# Patient Record
Sex: Male | Born: 1990 | Race: Black or African American | Hispanic: No | Marital: Single | Smoking: Never smoker
Health system: Southern US, Community
[De-identification: ages and names within clinical notes are randomized; demographics above are authoritative.]

## PROBLEM LIST (undated history)

## (undated) HISTORY — PX: LEG SURGERY: SHX1003

## (undated) HISTORY — PX: EYE SURGERY: SHX253

---

## 2016-04-11 ENCOUNTER — Encounter (HOSPITAL_COMMUNITY): Payer: Self-pay | Admitting: Emergency Medicine

## 2016-04-11 ENCOUNTER — Emergency Department (HOSPITAL_COMMUNITY): Payer: Self-pay

## 2016-04-11 ENCOUNTER — Emergency Department (HOSPITAL_COMMUNITY)
Admission: EM | Admit: 2016-04-11 | Discharge: 2016-04-11 | Disposition: A | Discharge status: Home / Self Care | Payer: Self-pay | Attending: Emergency Medicine | Admitting: Emergency Medicine

## 2016-04-11 DIAGNOSIS — Y939 Activity, unspecified: Secondary | ICD-10-CM | POA: Insufficient documentation

## 2016-04-11 DIAGNOSIS — S82392A Other fracture of lower end of left tibia, initial encounter for closed fracture: Secondary | ICD-10-CM

## 2016-04-11 DIAGNOSIS — S82832A Other fracture of upper and lower end of left fibula, initial encounter for closed fracture: Secondary | ICD-10-CM | POA: Insufficient documentation

## 2016-04-11 DIAGNOSIS — Y9289 Other specified places as the place of occurrence of the external cause: Secondary | ICD-10-CM | POA: Insufficient documentation

## 2016-04-11 DIAGNOSIS — S82402A Unspecified fracture of shaft of left fibula, initial encounter for closed fracture: Secondary | ICD-10-CM

## 2016-04-11 DIAGNOSIS — W010XXA Fall on same level from slipping, tripping and stumbling without subsequent striking against object, initial encounter: Secondary | ICD-10-CM | POA: Insufficient documentation

## 2016-04-11 DIAGNOSIS — Y999 Unspecified external cause status: Secondary | ICD-10-CM | POA: Insufficient documentation

## 2016-04-11 DIAGNOSIS — S82892A Other fracture of left lower leg, initial encounter for closed fracture: Secondary | ICD-10-CM

## 2016-04-11 DIAGNOSIS — S82202A Unspecified fracture of shaft of left tibia, initial encounter for closed fracture: Secondary | ICD-10-CM

## 2016-04-11 MED ORDER — NAPROXEN 500 MG PO TABS: 500.0000 mg | ORAL_TABLET | Freq: Two times a day (BID) | ORAL | 0 refills | Status: AC | PRN

## 2016-04-11 MED ORDER — HYDROCODONE-ACETAMINOPHEN 5-325 MG PO TABS: 1.0000 | ORAL_TABLET | Freq: Four times a day (QID) | ORAL | 0 refills | Status: AC | PRN

## 2016-04-11 MED ORDER — HYDROCODONE-ACETAMINOPHEN 5-325 MG PO TABS
1.0000 | ORAL_TABLET | Freq: Once | ORAL | Status: AC
  Administered 2016-04-11: 1 via ORAL
  Filled 2016-04-11: qty 1

## 2016-04-11 NOTE — Discharge Instructions (Signed)
Wear ankle splint at all times until you see the orthopedist. Use crutches for all weight bearing activities. Ice and elevate ankle throughout the day, using ice pack for no more than 20 minutes every hour.  Alternate between naprosyn and norco for pain relief. Do not drive or operate machinery with pain medication use. Call your regular doctor first thing Monday morning to ask for a referral to an orthopedist in your area for ongoing management of your ankle fracture; if you stay in the [place] area then you can call the orthopedist listed above for ongoing management of your ankle fracture in the next 3-5 days. Return to the ER for changes or worsening symptoms.

## 2016-04-11 NOTE — ED Provider Notes (Addendum)
[facility]-EMERGENCY DEPT Provider Note   CSN: [id number] Arrival date & time: 04/11/16  0545     History   Chief Complaint Chief Complaint  Patient presents with  . Ankle Injury    HPI Terry Chandler is a 26 y.o. male, who presents to the ED with complaints of left ankle injury sustained after he twisted his ankle while he was stepping back, causing him to fall landing onto his ankle. He describes the pain as 8/10 constant dull left ankle pain radiating into the bottom of his foot, worse with walking and movement, improved with rest, and with no treatments tried prior to arrival. Associated symptoms include swelling and bruising. He denies head injury, LOC, numbness, tingling, focal weakness, or any other injury sustained. Denies any other complaints at this time. He has never injured this ankle before, and has never been seen by an orthopedic doctor. He is traveling from out of town, he will be returning to [place] where he lives today. He does have PCP back in [place]. He had no plans of staying in [place] any longer than today.   The history is provided by the patient and medical records. No language interpreter was used.  Ankle Injury  This is a new problem. The current episode started 6 to 12 hours ago. The problem occurs constantly. The problem has not changed since onset.The symptoms are aggravated by walking and standing. The symptoms are relieved by rest. He has tried nothing for the symptoms. The treatment provided no relief.    History reviewed. No pertinent past medical history.  There are no active problems to display for this patient.   Past Surgical History:  Procedure Laterality Date  . EYE SURGERY         Home Medications    Prior to Admission medications   Not on File    Family History No family history on file.  Social History Social History  Substance Use Topics  . Smoking status: Never Smoker  . Smokeless tobacco: Never Used  . Alcohol use Yes     Comment: 1x a week     Allergies   Patient has no allergy information on record.   Review of Systems Review of Systems  HENT: Negative for facial swelling (no head inj).   Musculoskeletal: Positive for arthralgias and joint swelling.  Skin: Positive for color change (bruising). Negative for wound.  Allergic/Immunologic: Negative for immunocompromised state.  Neurological: Negative for weakness and numbness.  Psychiatric/Behavioral: Negative for confusion.   10 Systems reviewed and are negative for acute change except as noted in the HPI.   Physical Exam Updated Vital Signs BP 142/88 (BP Location: Right Arm)   Pulse 90   Temp 98.1 F (36.7 C) (Oral)   Resp 18   SpO2 100%    Physical Exam  Constitutional: He is oriented to person, place, and time. Vital signs are normal. He appears well-developed and well-nourished.  Non-toxic appearance. No distress.  Afebrile, nontoxic, NAD  HENT:  Head: Normocephalic and atraumatic.  Mouth/Throat: Mucous membranes are normal.  Eyes: Conjunctivae and EOM are normal. Right eye exhibits no discharge. Left eye exhibits no discharge.  Neck: Normal range of motion. Neck supple.  Cardiovascular: Normal rate and intact distal pulses.   Pulmonary/Chest: Effort normal. No respiratory distress.  Abdominal: Normal appearance. He exhibits no distension.  Musculoskeletal:       Left ankle: He exhibits decreased range of motion (due to pain), swelling and ecchymosis. He exhibits no deformity, no laceration  and normal pulse. Tenderness. Lateral malleolus and medial malleolus tenderness found. Achilles tendon normal.  L ankle with limited ROM due to pain, +swelling, no crepitus or deformity, with moderate TTP of the lateral and medial malleoli, but no TTP or swelling of fore foot or calf. No break in skin. +bruising without erythema. No warmth. Achilles intact. Good pedal pulse and cap refill of all toes. Wiggling toes without difficulty. Sensation grossly  intact. Soft compartments   Neurological: He is alert and oriented to person, place, and time. He has normal strength. No sensory deficit.  Skin: Skin is warm, dry and intact. No rash noted.  Psychiatric: He has a normal mood and affect.  Nursing note and vitals reviewed.    ED Treatments / Results  Labs (all labs ordered are listed, but only abnormal results are displayed) Labs Reviewed - No data to display  EKG  EKG Interpretation None       Radiology Dg Ankle Complete Left  Result Date: 04/11/2016 CLINICAL DATA:  26 y/o  M; left ankle pain after tripping. EXAM: LEFT ANKLE COMPLETE - 3+ VIEW COMPARISON:  None. FINDINGS: Mildly displaced oblique acute fracture through the lower fibula with fracture line above level of tibial plafond. Probable nondisplaced fracture of the posterior malleolus of tibia. The medial ankle mortise is markedly widened indicating ankle instability. Talar dome is intact. Soft tissue swelling about the ankle joint. Small plantar calcaneal enthesophyte. IMPRESSION: 1. Mildly displaced oblique acute fracture through the lower fibula with fracture line above level of tibial plafond. 2. Probable nondisplaced fracture of the posterior malleolus of tibia. 3. Marked widening of the medial ankle mortise indicating ankle instability. Electronically Signed   By: Mitzi Hansen M.D.   On: 04/11/2016 06:29    Procedures Procedures (including critical care time)  SPLINT APPLICATION Date/Time: 8:26 AM Authorized by: Rhona Raider Consent: Verbal consent obtained. Risks and benefits: risks, benefits and alternatives were discussed Consent given by: patient Splint applied by: orthopedic technician Location details: L ankle Splint type: cadillac splint Supplies used: orthoglass Post-procedure: The splinted body part was neurovascularly unchanged following the procedure. Patient tolerance: Patient tolerated the procedure well with no immediate  complications.     Medications Ordered in ED Medications  HYDROcodone-acetaminophen (NORCO/VICODIN) 5-325 MG per tablet 1 tablet (not administered)     Initial Impression / Assessment and Plan / ED Course  I have reviewed the triage vital signs and the nursing notes.  Pertinent labs & imaging results that were available during my care of the patient were reviewed by me and considered in my medical decision making (see chart for details).     26 y.o. male here with L ankle pain/swelling after twisting it earlier tonight. NVI with soft compartments, swelling and bruising and tenderness to both malleoli of L ankle, no foot or calf tenderness, achilles intact. Xray reveals fibular fx and posterior malleolus fx with widening of ankle mortise indicating instability. Pt is from out of town, was planning on returning to his hometown today; discussed that this is going to need surgery, however not emergently today, but that he will need to f/up with orthopedist ASAP when he returns home. Advised that he call his PCP on Monday to get a referral to orthopedist group there, for f/up in 3-5 days. Pain meds given. Splint applied and crutches given. Strict return precautions advised. I explained the diagnosis and have given explicit precautions to return to the ER including for any other new or worsening symptoms. The patient  understands and accepts the medical plan as it's been dictated and I have answered their questions. Discharge instructions concerning home care and prescriptions have been given. The patient is STABLE and is discharged to home in good condition.   Final Clinical Impressions(s) / ED Diagnoses   Final diagnoses:  Closed fracture of left ankle, initial encounter  Tibia/fibula fracture, left, closed, initial encounter  Closed fracture of posterior malleolus of left tibia, initial encounter  Closed fracture of distal end of left fibula, unspecified fracture morphology, initial encounter     New Prescriptions New Prescriptions   HYDROCODONE-ACETAMINOPHEN (NORCO) 5-325 MG TABLET    Take 1-2 tablets by mouth every 6 (six) hours as needed for severe pain.   NAPROXEN (NAPROSYN) 500 MG TABLET    Take 1 tablet (500 mg total) by mouth 2 (two) times daily as needed for mild pain, moderate pain or headache (TAKE WITH MEALS.).     [address] PA-C 04/11/16 0830    Canary Brim Tegeler, MD 04/12/16 0111   ADDENDUM 04/21/16: per billing inquiry, this note has been addended to include the specific fracture site in the ICD diagnosis codes; however, it should be noted that in the original MDM, those fracture sites were included, as outlined above. For simplicity sake, the fracture sites again are: left distal fibula (mildly displaced, oblique fx), and left posterior malleolus of tibia (nondisplaced).    [address] PA-C 04/21/16 1256    Heide Scales, MD 04/22/16 2013

## 2016-04-11 NOTE — ED Triage Notes (Signed)
Pt c/o left ankle pain from tripping at the club tonight; unable to put pressure on ankle

## 2016-04-11 NOTE — ED Notes (Signed)
Ortho tech called 

## 2018-03-10 IMAGING — CR DG ANKLE COMPLETE 3+V*L*
3 series · 3 of 3 positions shown · non-contrast
Comparison: None.

CLINICAL DATA: 25 y/o  M; left ankle pain after tripping.

EXAM:
LEFT ANKLE COMPLETE - 3+ VIEW

[x ankle ap left]
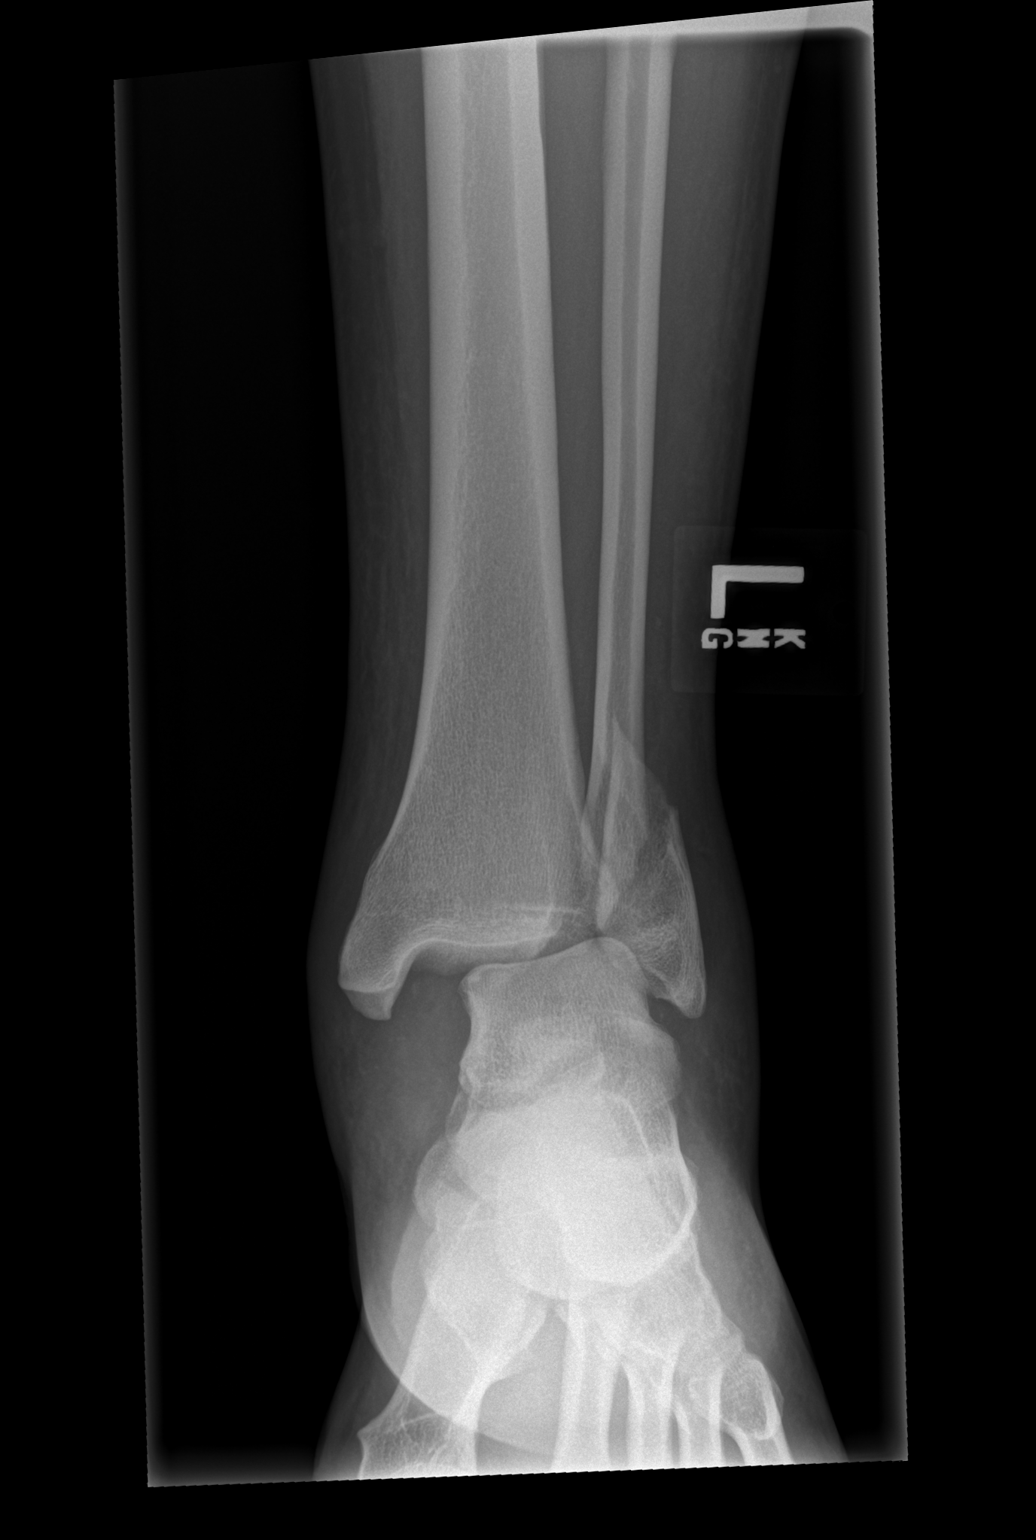

[x ankle obl left]
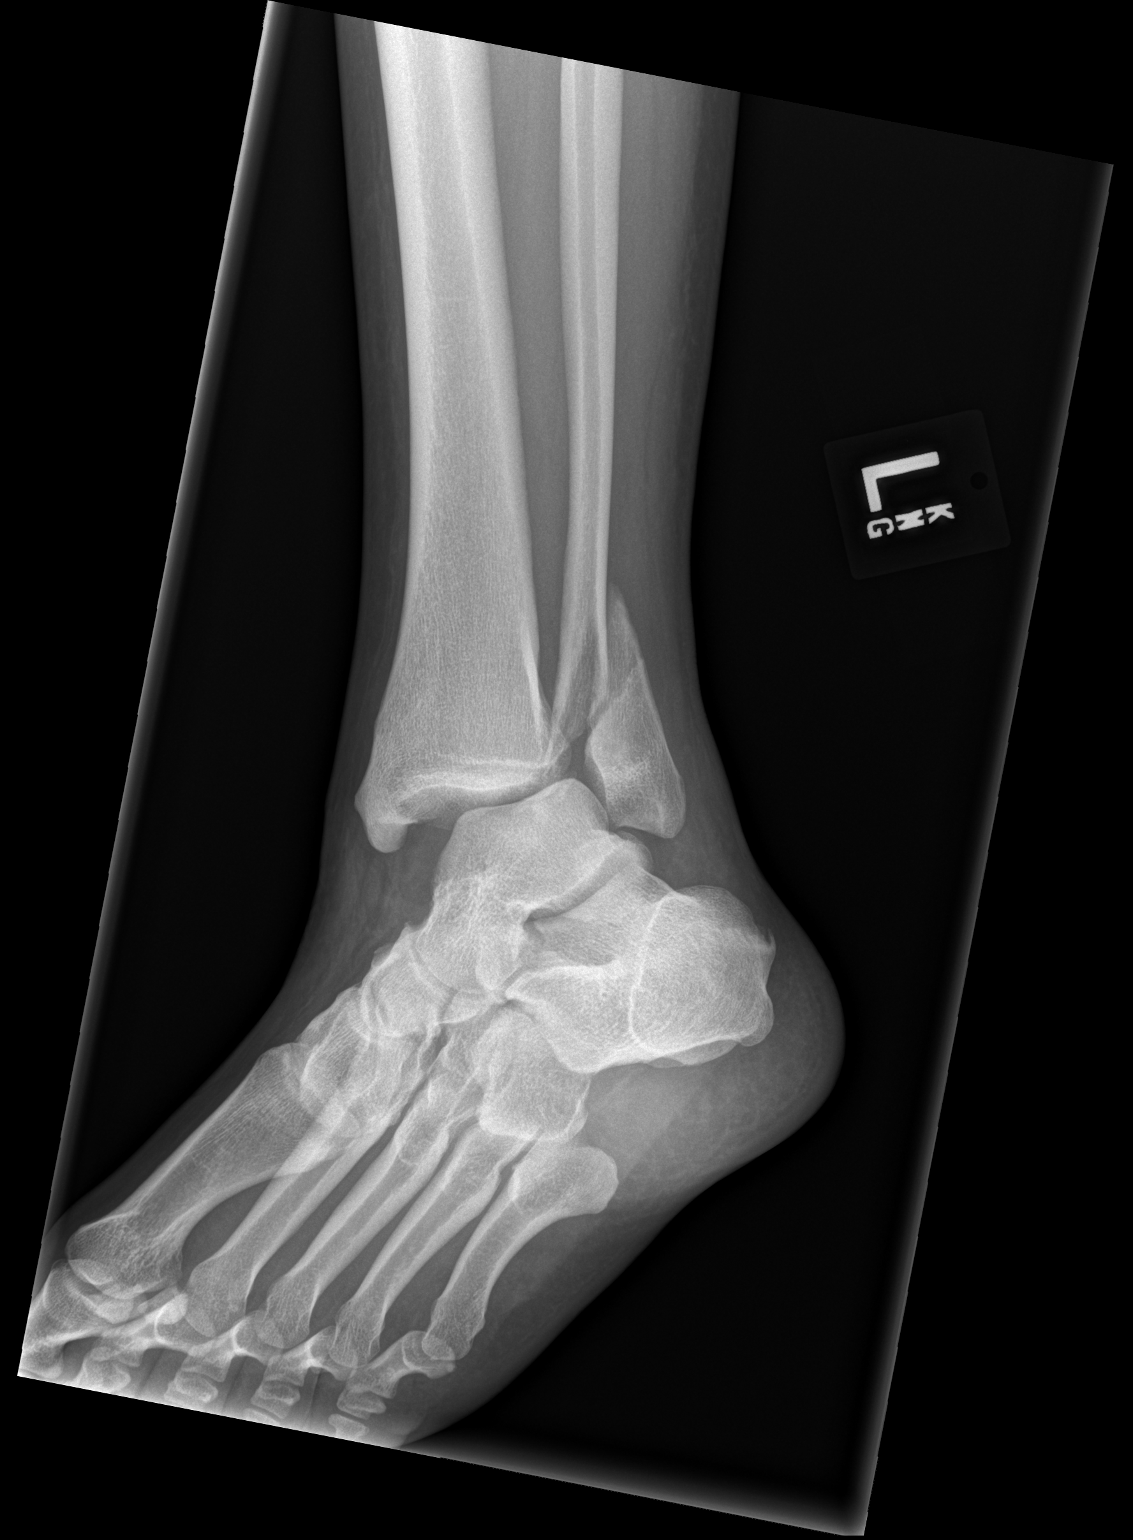

[x ankle lat left]
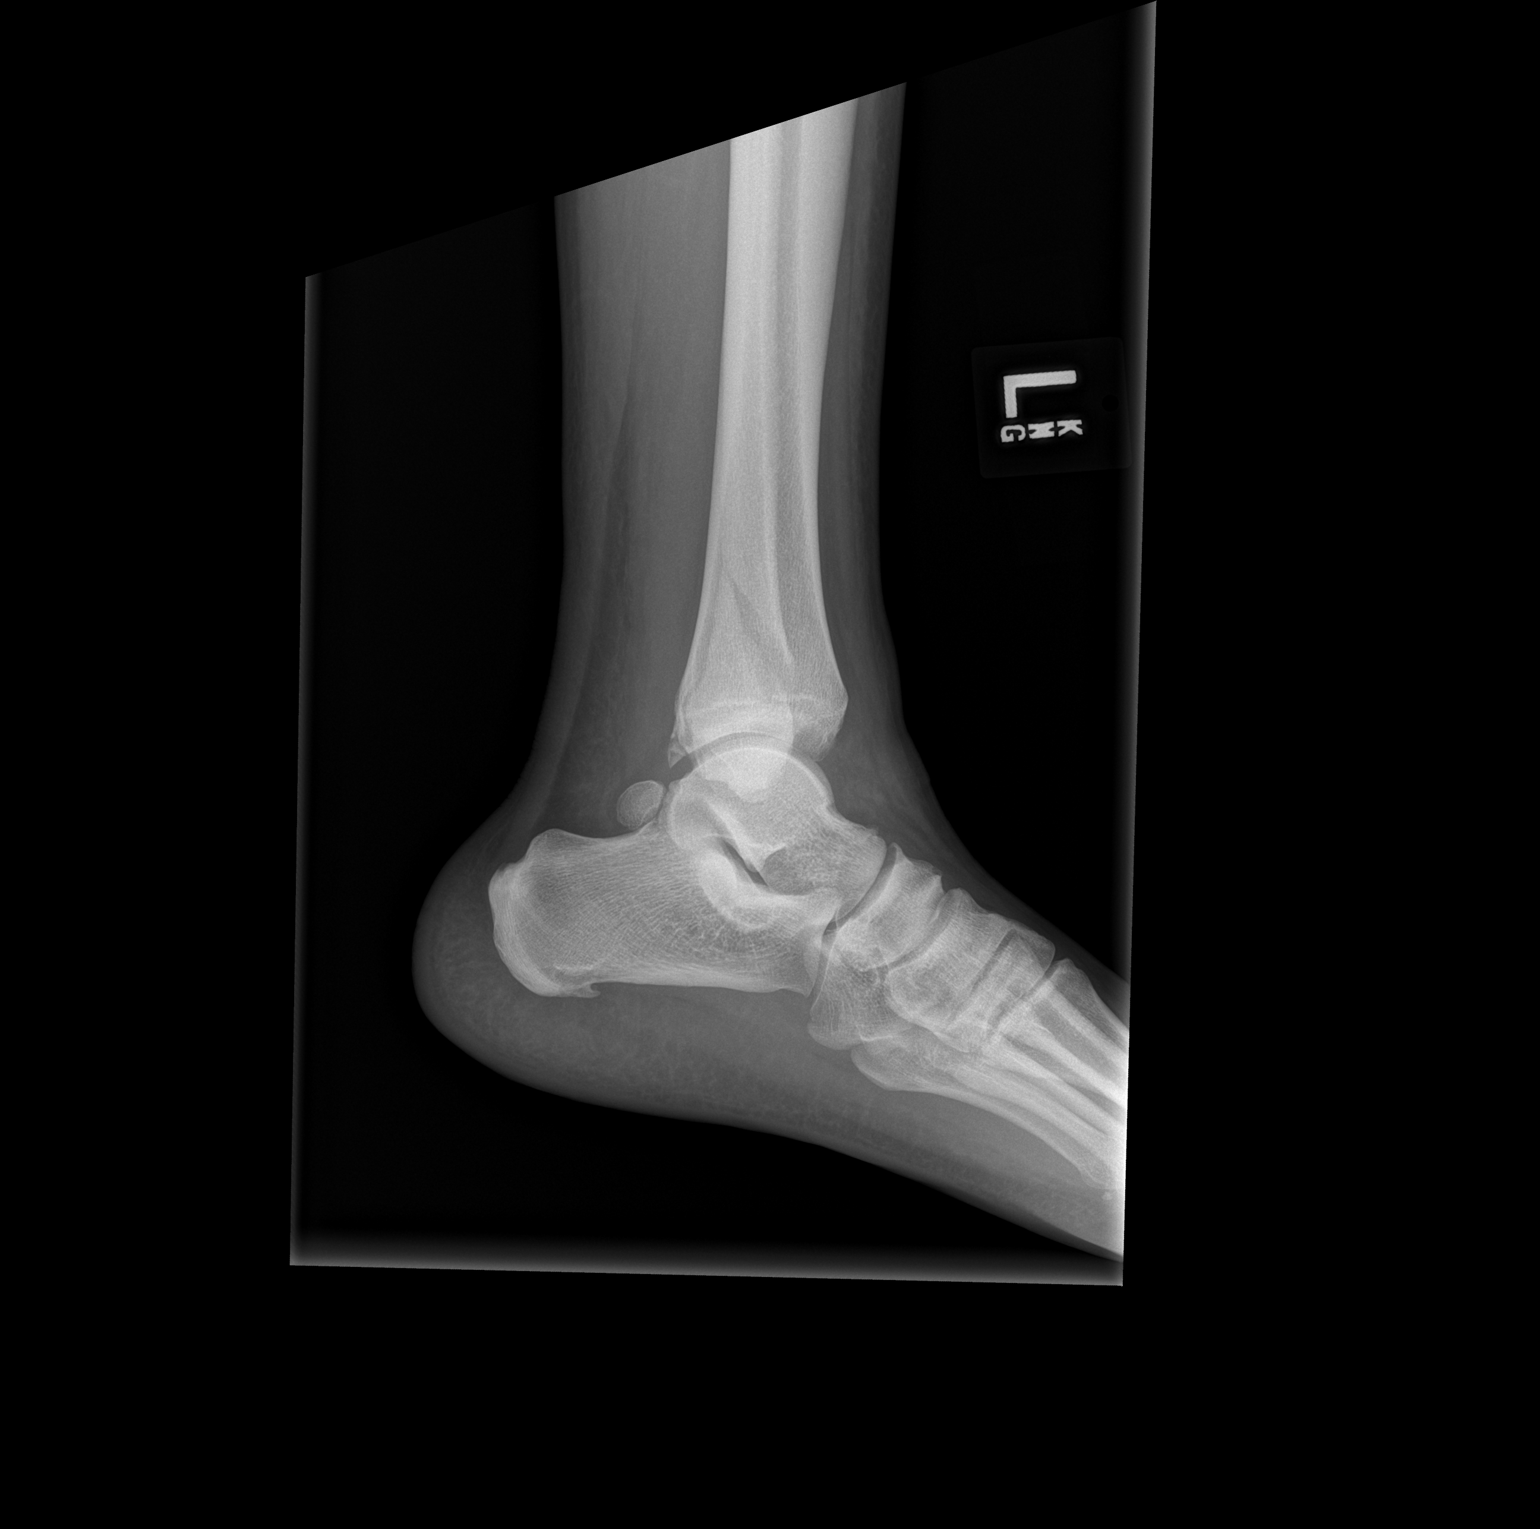

[3 of 3 positions shown; findings below may reference images not displayed]

FINDINGS: Mildly displaced oblique acute fracture through the lower fibula
with fracture line above level of tibial plafond. Probable
nondisplaced fracture of the posterior malleolus of tibia. The
medial ankle mortise is markedly widened indicating ankle
instability. Talar dome is intact. Soft tissue swelling about the
ankle joint. Small plantar calcaneal enthesophyte.
IMPRESSION: 1. Mildly displaced oblique acute fracture through the lower fibula
with fracture line above level of tibial plafond.
2. Probable nondisplaced fracture of the posterior malleolus of
tibia.
3. Marked widening of the medial ankle mortise indicating ankle
instability.

By: Guy Patrick Ayaou M.D.

## 2019-06-05 ENCOUNTER — Ambulatory Visit (HOSPITAL_COMMUNITY): Admission: EM | Admit: 2019-06-05 | Discharge: 2019-06-05 | Discharge status: Against Medical Advice | Payer: Self-pay

## 2019-06-05 ENCOUNTER — Other Ambulatory Visit: Payer: Self-pay

## 2019-07-30 ENCOUNTER — Ambulatory Visit (HOSPITAL_COMMUNITY)
Admission: EM | Admit: 2019-07-30 | Discharge: 2019-07-30 | Disposition: A | Discharge status: Home / Self Care | Payer: BC Managed Care – PPO | Attending: Family Medicine | Admitting: Family Medicine

## 2019-07-30 ENCOUNTER — Other Ambulatory Visit: Payer: Self-pay

## 2019-07-30 ENCOUNTER — Encounter (HOSPITAL_COMMUNITY): Payer: Self-pay

## 2019-07-30 DIAGNOSIS — K6289 Other specified diseases of anus and rectum: Secondary | ICD-10-CM

## 2019-07-30 DIAGNOSIS — K5900 Constipation, unspecified: Secondary | ICD-10-CM | POA: Diagnosis not present

## 2019-07-30 MED ORDER — DIBUCAINE (PERIANAL) 1 % EX OINT: 1.0000 "application " | TOPICAL_OINTMENT | CUTANEOUS | 0 refills | Status: AC | PRN

## 2019-07-30 NOTE — ED Provider Notes (Signed)
[facility]   [id number] 07/30/19 Arrival Time: 1143  CC: ABDOMINAL PAIN  SUBJECTIVE:  Terry Chandler is a 29 y.o. male who presents with complaint of abdominal discomfort that began abruptlyabout 11:00am this morning. Denies a precipitating event, trauma, close contacts with similar symptoms, recent travel or antibiotic use. Localizes pain to rectum. Describes as intermittent and burning in character. Reports that he was constipated and that he had a painful bowel movement. Reports that he had a second bowel movement that has some blood in it with rectal pain and burning. Has not tried OTC medications. Reports that he had spicy chicken wings for dinner last night. Denies alleviating or aggravating factors. Denies similar symptoms in the past.  Last BM today.    Denies fever, chills, appetite changes, weight changes, nausea, vomiting, chest pain, SOB, diarrhea, melena, dysuria, difficulty urinating, increased frequency or urgency, flank pain, loss of bowel or bladder function, vaginal discharge, vaginal odor, vaginal bleeding, dyspareunia, pelvic pain.     No LMP for male patient.  ROS: As per HPI.  All other pertinent ROS negative.     History reviewed. No pertinent past medical history. Past Surgical History:  Procedure Laterality Date  . EYE SURGERY    . LEG SURGERY     No Known Allergies No current facility-administered medications on file prior to encounter.   Current Outpatient Medications on File Prior to Encounter  Medication Sig Dispense Refill  . HYDROcodone-acetaminophen (NORCO) 5-325 MG tablet Take 1-2 tablets by mouth every 6 (six) hours as needed for severe pain. 30 tablet 0  . naproxen (NAPROSYN) 500 MG tablet Take 1 tablet (500 mg total) by mouth 2 (two) times daily as needed for mild pain, moderate pain or headache (TAKE WITH MEALS.). 20 tablet 0   Social History   Socioeconomic History  . Marital status: Single    Spouse name: Not on file  . Number of  children: Not on file  . Years of education: Not on file  . Highest education level: Not on file  Occupational History  . Not on file  Tobacco Use  . Smoking status: Never Smoker  . Smokeless tobacco: Never Used  Substance and Sexual Activity  . Alcohol use: Yes    Comment: 1x a week  . Drug use: No  . Sexual activity: Never  Other Topics Concern  . Not on file  Social History Narrative  . Not on file   Social Determinants of Health   Financial Resource Strain:   . Difficulty of Paying Living Expenses:   Food Insecurity:   . Worried About Programme researcher, broadcasting/film/video in the Last Year:   . Barista in the Last Year:   Transportation Needs:   . Freight forwarder (Medical):   Marland Kitchen Lack of Transportation (Non-Medical):   Physical Activity:   . Days of Exercise per Week:   . Minutes of Exercise per Session:   Stress:   . Feeling of Stress :   Social Connections:   . Frequency of Communication with Friends and Family:   . Frequency of Social Gatherings with Friends and Family:   . Attends Religious Services:   . Active Member of Clubs or Organizations:   . Attends Banker Meetings:   Marland Kitchen Marital Status:   Intimate Partner Violence:   . Fear of Current or Ex-Partner:   . Emotionally Abused:   Marland Kitchen Physically Abused:   . Sexually Abused:    Family History  Family history unknown: Yes     OBJECTIVE:  Vitals:   07/30/19 1256  BP: 121/75  Pulse: 80  Resp: 18  Temp: 98.6 F (37 C)  TempSrc: Oral  SpO2: 99%    General appearance: Alert; NAD HEENT: NCAT.  Oropharynx clear.  Lungs: clear to auscultation bilaterally without adventitious breath sounds Heart: regular rate and rhythm.  Radial pulses 2+ symmetrical bilaterally Abdomen: soft, non-distended; normal active bowel sounds; non-tender to light and deep palpation; nontender at McBurney's point; negative Murphy's sign; negative rebound; no guarding Back: no CVA tenderness Extremities: no edema;  symmetrical with no gross deformities Skin: warm and dry Neurologic: normal gait Psychological: alert and cooperative; normal mood and affect  LABS: No results found for this or any previous visit (from the past 24 hour(s)).  DIAGNOSTIC STUDIES: No results found.   ASSESSMENT & PLAN:  1. Constipation, unspecified constipation type   2. Rectal pain     Meds ordered this encounter  Medications  . dibucaine (NUPERCAINAL) 1 % OINT    Sig: Place 1 application rectally as needed for hemorrhoids.    Dispense:  28 g    Refill:  0    Order Specific Question:   Supervising Provider    Answer:   Chase Picket [[id number]]     Constipation Use OTC Miralax in warm liquids, drink plenty of water to help soften stools Get rest and drink fluids  Rectal Pain Declined rectal exam in office today Prescribed Dibucaine as needed for rectal discomfort Likely from hard stool or internal hemorrhoid  Increase your fluid intake. Avoid milk, greasy foods and anything that doesn't agree with you.  If you experience new or worsening symptoms return or go to ER such as fever, chills, nausea, vomiting, diarrhea, bloody or dark tarry stools, constipation, urinary symptoms, worsening abdominal discomfort, symptoms that do not improve with medications, inability to keep fluids down.  Reviewed expectations re: course of current medical issues. Questions answered. Outlined signs and symptoms indicating need for more acute intervention. Patient verbalized understanding. After Visit Summary given.    Faustino Congress, NP 07/30/19 1501

## 2019-07-30 NOTE — ED Triage Notes (Signed)
Pt presents with blood in stool since last night with some rectal soreness.

## 2019-07-30 NOTE — Discharge Instructions (Addendum)
Get some Miralax and try a dose in some warm liquids to help soften stools  I have sent in some ointment for you to use on your rectum as well  Go to the ER if your symptoms are getting much worse, having trouble breathing or swallowing

## 2019-08-23 ENCOUNTER — Ambulatory Visit: Payer: BC Managed Care – PPO | Attending: Internal Medicine

## 2019-08-23 DIAGNOSIS — Z20822 Contact with and (suspected) exposure to covid-19: Secondary | ICD-10-CM

## 2019-08-24 LAB — SARS-COV-2, NAA 2 DAY TAT

## 2019-08-24 LAB — NOVEL CORONAVIRUS, NAA: SARS-CoV-2, NAA: NOT DETECTED
